# Patient Record
Sex: Female | Born: 1980 | Race: White | Marital: Single | Smoking: Never smoker
Health system: Southern US, Community
[De-identification: ages and names within clinical notes are randomized; demographics above are authoritative.]

## PROBLEM LIST (undated history)

## (undated) DIAGNOSIS — M419 Scoliosis, unspecified: Secondary | ICD-10-CM

## (undated) DIAGNOSIS — F329 Major depressive disorder, single episode, unspecified: Secondary | ICD-10-CM

## (undated) DIAGNOSIS — F32A Depression, unspecified: Secondary | ICD-10-CM

## (undated) HISTORY — DX: Scoliosis, unspecified: M41.9

## (undated) HISTORY — PX: LEEP: SHX91

## (undated) HISTORY — DX: Major depressive disorder, single episode, unspecified: F32.9

## (undated) HISTORY — DX: Depression, unspecified: F32.A

---

## 2016-01-08 ENCOUNTER — Ambulatory Visit (INDEPENDENT_AMBULATORY_CARE_PROVIDER_SITE_OTHER): Payer: Self-pay

## 2016-01-08 ENCOUNTER — Ambulatory Visit (INDEPENDENT_AMBULATORY_CARE_PROVIDER_SITE_OTHER): Payer: Self-pay | Admitting: Urgent Care

## 2016-01-08 VITALS — BP 118/70 | HR 105 | Temp 98.3°F | Resp 20 | Ht 69.5 in | Wt 185.4 lb

## 2016-01-08 DIAGNOSIS — N9489 Other specified conditions associated with female genital organs and menstrual cycle: Secondary | ICD-10-CM

## 2016-01-08 DIAGNOSIS — Z8739 Personal history of other diseases of the musculoskeletal system and connective tissue: Secondary | ICD-10-CM

## 2016-01-08 DIAGNOSIS — M545 Low back pain: Secondary | ICD-10-CM

## 2016-01-08 DIAGNOSIS — R102 Pelvic and perineal pain: Secondary | ICD-10-CM

## 2016-01-08 DIAGNOSIS — Z720 Tobacco use: Secondary | ICD-10-CM

## 2016-01-08 LAB — POCT URINALYSIS DIP (MANUAL ENTRY)
BILIRUBIN UA: NEGATIVE
GLUCOSE UA: NEGATIVE
Ketones, POC UA: NEGATIVE
Leukocytes, UA: NEGATIVE
Nitrite, UA: NEGATIVE
Protein Ur, POC: NEGATIVE
RBC UA: NEGATIVE
SPEC GRAV UA: 1.015
Urobilinogen, UA: 0.2
pH, UA: 6

## 2016-01-08 LAB — POC MICROSCOPIC URINALYSIS (UMFC): MUCUS RE: ABSENT

## 2016-01-08 LAB — POCT URINE PREGNANCY: PREG TEST UR: NEGATIVE

## 2016-01-08 MED ORDER — MELOXICAM 7.5 MG PO TABS: 7.5000 mg | ORAL_TABLET | Freq: Every day | ORAL | Status: AC

## 2016-01-08 MED ORDER — TIZANIDINE HCL 2 MG PO TABS: 2.0000 mg | ORAL_TABLET | Freq: Three times a day (TID) | ORAL | Status: DC | PRN

## 2016-01-08 NOTE — Progress Notes (Signed)
MRN: [id number] DOB: 1981/03/30  Subjective:   Courtney Cameron is a 35 y.o. female with pmh of scoliosis presenting for chief complaint of Back Pain  Reports ~1 week history of progressively worsening low back pain that radiates down to her knees bilaterally. Patient has had back pain before, has been helped by a chiropractor before. She went again 3 times in the past week without any relief. She has also had some pelvic pressure and difficulty defecating due to her back pain. Also tried Flexeril,  ibuprofen, tizanidine (did the best for her). Denies numbness or tingling, weakness, incontinence, fever, n/v, abdominal pain, dysuria, hematuria. Smokes 1/2 ppd, 10 year smoking history. Has 1-2 drinks of alcohol per week. Uses marijuana once every 2 months. Family history is positive for bladder cancer, currently in remission.  Courtney Cameron has a current medication list which includes the following prescription(s): cyclobenzaprine hcl and ibuprofen. Also has No Known Allergies.  Courtney Cameron  has a past medical history of Depression and Scoliosis. Also  has past surgical history that includes LEEP.  Objective:   Vitals: BP 118/70 mmHg  Pulse 105  Temp(Src) 98.3 F (36.8 C) (Oral)  Resp 20  Ht 5' 9.5" (1.765 m)  Wt 185 lb 6.4 oz (84.097 kg)  BMI 27.00 kg/m2  SpO2 99%  LMP 12/30/2015  Physical Exam  Constitutional: She is oriented to person, place, and time. She appears well-developed and well-nourished.  Cardiovascular: Normal rate, regular rhythm and intact distal pulses.  Exam reveals no gallop and no friction rub.   No murmur heard. Pulmonary/Chest: No respiratory distress. She has no wheezes. She has no rales.  Abdominal: Soft. Bowel sounds are normal. She exhibits no distension and no mass. There is no tenderness.  No CVA tenderness.  Musculoskeletal: She exhibits no edema.       Lumbar back: She exhibits decreased range of motion (flexion>extension) and tenderness (over area  depicted). She exhibits no swelling, no edema, no deformity, no laceration and no spasm.       Back:  Negative SLR.  Neurological: She is alert and oriented to person, place, and time. She has normal reflexes.  Skin: Skin is warm and dry.   Dg Lumbar Spine Complete  01/08/2016  CLINICAL DATA:  Lumbago EXAM: LUMBAR SPINE - COMPLETE 4+ VIEW COMPARISON:  None. FINDINGS: Frontal, lateral, spot lumbosacral lateral, and bilateral oblique views were obtained. There are 5 non-rib-bearing lumbar type vertebral bodies. There is levoscoliosis with a mild rotatory component. There is no fracture or spondylolisthesis. Disc spaces appear normal. There is no appreciable facet arthropathy. IMPRESSION: Scoliosis. No fracture or spondylolisthesis. No appreciable arthropathic change. Electronically Signed   By: Bretta Bang III M.D.   On: 01/08/2016 14:37   Results for orders placed or performed in visit on 01/08/16 (from the past 24 hour(s))  POCT urinalysis dipstick     Status: Abnormal   Collection Time: 01/08/16  2:37 PM  Result Value Ref Range   Color, UA yellow yellow   Clarity, UA cloudy (A) clear   Glucose, UA negative negative   Bilirubin, UA negative negative   Ketones, POC UA negative negative   Spec Grav, UA 1.015    Blood, UA negative negative   pH, UA 6.0    Protein Ur, POC negative negative   Urobilinogen, UA 0.2    Nitrite, UA Negative Negative   Leukocytes, UA Negative Negative  POCT Microscopic Urinalysis ([facility])     Status: Abnormal   Collection Time: 01/08/16  2:37 PM  Result Value Ref Range   WBC,UR,HPF,POC None None WBC/hpf   RBC,UR,HPF,POC None None RBC/hpf   Bacteria None None, Too numerous to count   Mucus Absent Absent   Epithelial Cells, UR Per Microscopy Few (A) None, Too numerous to count cells/hpf  POCT urine pregnancy     Status: None   Collection Time: 01/08/16  2:37 PM  Result Value Ref Range   Preg Test, Ur Negative Negative   Assessment and Plan :   1.  Bilateral low back pain, with sciatica presence unspecified 2. Pelvic pressure in female 3. History of scoliosis - Physical exam findings are reassuring, will manage back pain that may be related to her scoliosis with meloxicam and tizanidine. RTC in 2 weeks if no improvement, consider steroid course, MRI, PT.  4. Tobacco use - Patient is working on smoking cessation and will let me know if she needs medical therapy to help with this.  Wallis Bamberg, PA-C [facility] [phone] 01/08/2016 1:54 PM

## 2016-01-08 NOTE — Patient Instructions (Addendum)
Because you received an x-ray today, you will receive an invoice from [facility]. Please contact [facility] at [phone] with questions or concerns regarding your invoice. Our billing staff will not be able to assist you with those questions.   You can use Menthol or Capsaicin creams for your back pain. Heating pads are okay but not for long term use.   Scoliosis Scoliosis is the name given to a spine that curves sideways.Scoliosis can cause twisting of your shoulders, hips, chest, back, and rib cage.  CAUSES  The cause of scoliosis is not always known. It may be caused by a birth defect or by a disease that can cause muscular dysfunction and imbalance, such as cerebral palsy and muscular dystrophy.  RISK FACTORS Having a disease that causes muscle disease or dysfunction. SIGNS AND SYMPTOMS Scoliosis often has no signs or symptoms.If they are present, they may include:  Unequal size of one body side compared to the other (asymmetry).  Visible curvature of the spine.  Pain. The pain may limit physical activity.  Shortness of breath.  Bowel or bladder issues. DIAGNOSIS A skilled health care provider will perform an evaluation. This will involve:  Taking your history.  Performing a physical examination.  Performing a neurological exam to detect nerve or muscle function loss.  Range of motion studies on the spine.  X-rays. An MRI may also be obtained. TREATMENT  Treatment varies depending on the nature, extent, and severity of the disease. If the curvature is not great, you may need only observation. A brace may be used to prevent scoliosis from progressing. A brace may also be needed during growth spurts. Physical therapy may be of benefit. Surgery may be required.  HOME CARE INSTRUCTIONS   Your health care provider may suggest exercises to strengthen your muscles. Perform them as directed.  Ask your health care provider before participating in any  sports.   If you have been prescribed an orthopedic brace, wear it as instructed by your health care provider. SEEK MEDICAL CARE IF: Your brace causes the skin to become sore (chafe) or is uncomfortable.  SEEK IMMEDIATE MEDICAL CARE IF:  You have back pain that is not relieved by the medicines prescribed by your health care provider.   Your legs feel weak or you lose function in your legs.  You lose some bowel or bladder control.    This information is not intended to replace advice given to you by your health care provider. Make sure you discuss any questions you have with your health care provider.   Document Released: 10/28/2000 Document Revised: 11/05/2013 Document Reviewed: 07/08/2013 Elsevier Interactive Patient Education 2016 [employer].    Smoking Cessation, Tips for Success If you are ready to quit smoking, congratulations! You have chosen to help yourself be healthier. Cigarettes bring nicotine, tar, carbon monoxide, and other irritants into your body. Your lungs, heart, and blood vessels will be able to work better without these poisons. There are many different ways to quit smoking. Nicotine gum, nicotine patches, a nicotine inhaler, or nicotine nasal spray can help with physical craving. Hypnosis, support groups, and medicines help break the habit of smoking. WHAT THINGS CAN I DO TO MAKE QUITTING EASIER?  Here are some tips to help you quit for good:  Pick a date when you will quit smoking completely. Tell all of your friends and family about your plan to quit on that date.  Do not try to slowly cut down on the number of cigarettes  you are smoking. Pick a quit date and quit smoking completely starting on that day.  Throw away all cigarettes.   Clean and remove all ashtrays from your home, work, and car.  On a card, write down your reasons for quitting. Carry the card with you and read it when you get the urge to smoke.  Cleanse your body of nicotine. Drink  enough water and fluids to keep your urine clear or pale yellow. Do this after quitting to flush the nicotine from your body.  Learn to predict your moods. Do not let a bad situation be your excuse to have a cigarette. Some situations in your life might tempt you into wanting a cigarette.  Never have "just one" cigarette. It leads to wanting another and another. Remind yourself of your decision to quit.  Change habits associated with smoking. If you smoked while driving or when feeling stressed, try other activities to replace smoking. Stand up when drinking your coffee. Brush your teeth after eating. Sit in a different chair when you read the paper. Avoid alcohol while trying to quit, and try to drink fewer caffeinated beverages. Alcohol and caffeine may urge you to smoke.  Avoid foods and drinks that can trigger a desire to smoke, such as sugary or spicy foods and alcohol.  Ask people who smoke not to smoke around you.  Have something planned to do right after eating or having a cup of coffee. For example, plan to take a walk or exercise.  Try a relaxation exercise to calm you down and decrease your stress. Remember, you may be tense and nervous for the first 2 weeks after you quit, but this will pass.  Find new activities to keep your hands busy. Play with a pen, coin, or rubber band. Doodle or draw things on paper.  Brush your teeth right after eating. This will help cut down on the craving for the taste of tobacco after meals. You can also try mouthwash.   Use oral substitutes in place of cigarettes. Try using lemon drops, carrots, cinnamon sticks, or chewing gum. Keep them handy so they are available when you have the urge to smoke.  When you have the urge to smoke, try deep breathing.  Designate your home as a nonsmoking area.  If you are a heavy smoker, ask your health care provider about a prescription for nicotine chewing gum. It can ease your withdrawal from nicotine.  Reward  yourself. Set aside the cigarette money you save and buy yourself something nice.  Look for support from others. Join a support group or smoking cessation program. Ask someone at home or at work to help you with your plan to quit smoking.  Always ask yourself, "Do I need this cigarette or is this just a reflex?" Tell yourself, "Today, I choose not to smoke," or "I do not want to smoke." You are reminding yourself of your decision to quit.  Do not replace cigarette smoking with electronic cigarettes (commonly called e-cigarettes). The safety of e-cigarettes is unknown, and some may contain harmful chemicals.  If you relapse, do not give up! Plan ahead and think about what you will do the next time you get the urge to smoke. HOW WILL I FEEL WHEN I QUIT SMOKING? You may have symptoms of withdrawal because your body is used to nicotine (the addictive substance in cigarettes). You may crave cigarettes, be irritable, feel very hungry, cough often, get headaches, or have difficulty concentrating. The withdrawal symptoms are only  temporary. They are strongest when you first quit but will go away within 10-14 days. When withdrawal symptoms occur, stay in control. Think about your reasons for quitting. Remind yourself that these are signs that your body is healing and getting used to being without cigarettes. Remember that withdrawal symptoms are easier to treat than the major diseases that smoking can cause.  Even after the withdrawal is over, expect periodic urges to smoke. However, these cravings are generally short lived and will go away whether you smoke or not. Do not smoke! WHAT RESOURCES ARE AVAILABLE TO HELP ME QUIT SMOKING? Your health care provider can direct you to community resources or hospitals for support, which may include:  Group support.  Education.  Hypnosis.  Therapy.   This information is not intended to replace advice given to you by your health care provider. Make sure you  discuss any questions you have with your health care provider.   Document Released: 07/29/2004 Document Revised: 11/21/2014 Document Reviewed: 04/18/2013 Elsevier Interactive Patient Education [employer]

## 2016-01-09 ENCOUNTER — Telehealth: Payer: Self-pay

## 2016-01-09 NOTE — Telephone Encounter (Signed)
PATIENT STATES SHE WAS IN THE OFFICE TO SEE MARIO MANI FOR BACK PAIN ON Friday. SHE SAID SHE FORGOT TO GET A DOCTOR'S NOTE FOR WORK. SHE WAS OUT ON FRI. 01/08/2016 AND WILL PROBABLY NEED TO BE OUT AT LEAST A WEEK. WHEN SHE IS CALLED BACK SHE WILL HAVE THE FAX NUMBER TO HER JOB SO THAT THE NOTE CAN BE FAXED. BEST PHONE [phone] (CELL)  MBC

## 2016-01-09 NOTE — Telephone Encounter (Signed)
PT CALLED BACK - WORK NOTE TO BE FAXED TO IS [phone] - ATTENTION GINA

## 2016-01-11 NOTE — Telephone Encounter (Signed)
Faxed

## 2017-01-07 ENCOUNTER — Other Ambulatory Visit: Payer: Self-pay | Admitting: Urgent Care

## 2017-01-07 NOTE — Telephone Encounter (Signed)
Pt's rx is expired and would like renewal for Tyzanadine 2mg / (pt still had refills)/ [facility] [address]  [phone] VM okay

## 2017-01-09 NOTE — Telephone Encounter (Signed)
Meds ordered this encounter  Medications  . tiZANidine (ZANAFLEX) 2 MG tablet    Sig: TAKE 1 TABLET BY MOUTH EVERY 8 HOURS AS NEEDED FOR MUSCLE SPASMS    Dispense:  30 tablet    Refill:  0    Please advise patient she needs re-evaluation for additional fills.

## 2017-04-20 IMAGING — CR DG LUMBAR SPINE COMPLETE 4+V
5 series · 5 of 5 positions shown · non-contrast
Comparison: None.

CLINICAL DATA: Lumbago

EXAM:
LUMBAR SPINE - COMPLETE 4+ VIEW

[AP (1 of 2)]
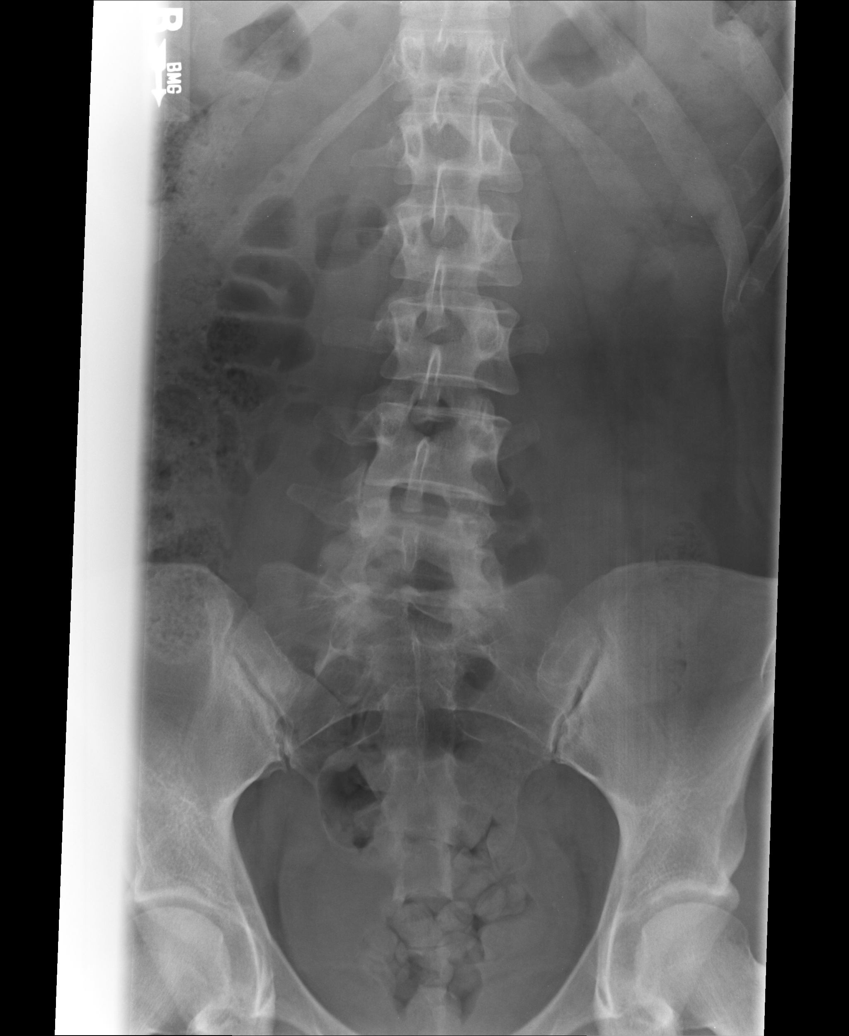

[AP (2 of 2)]
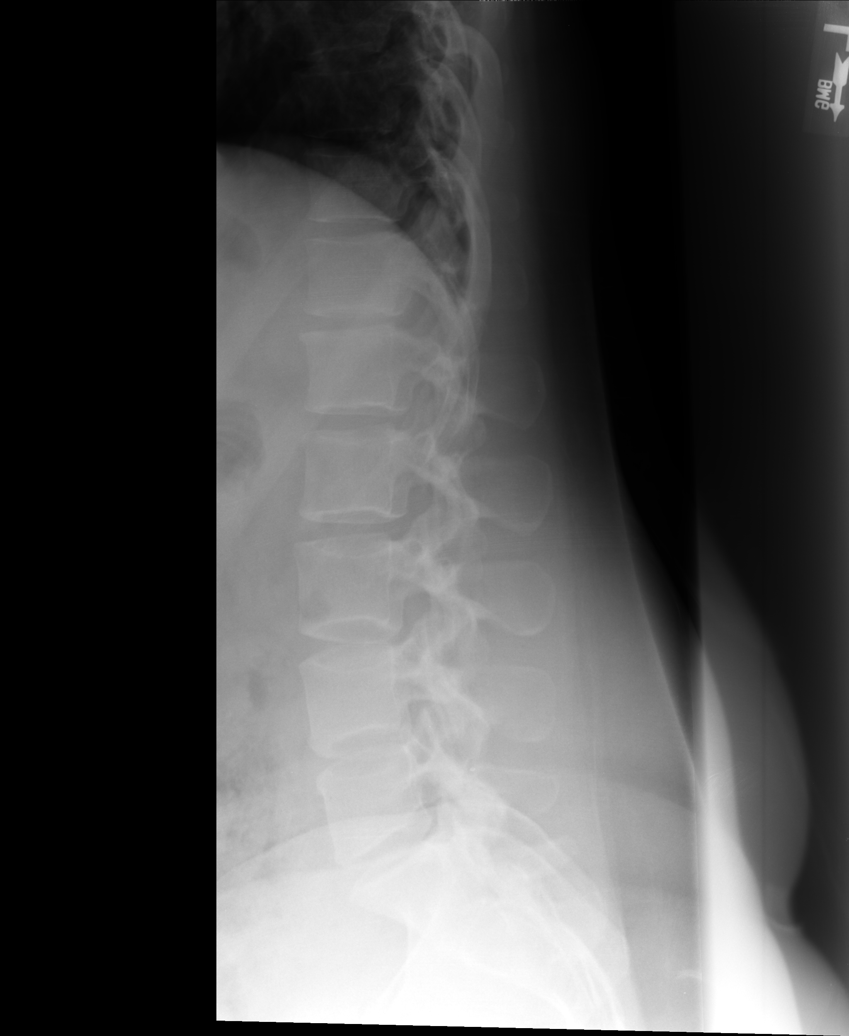

[l5 s1]
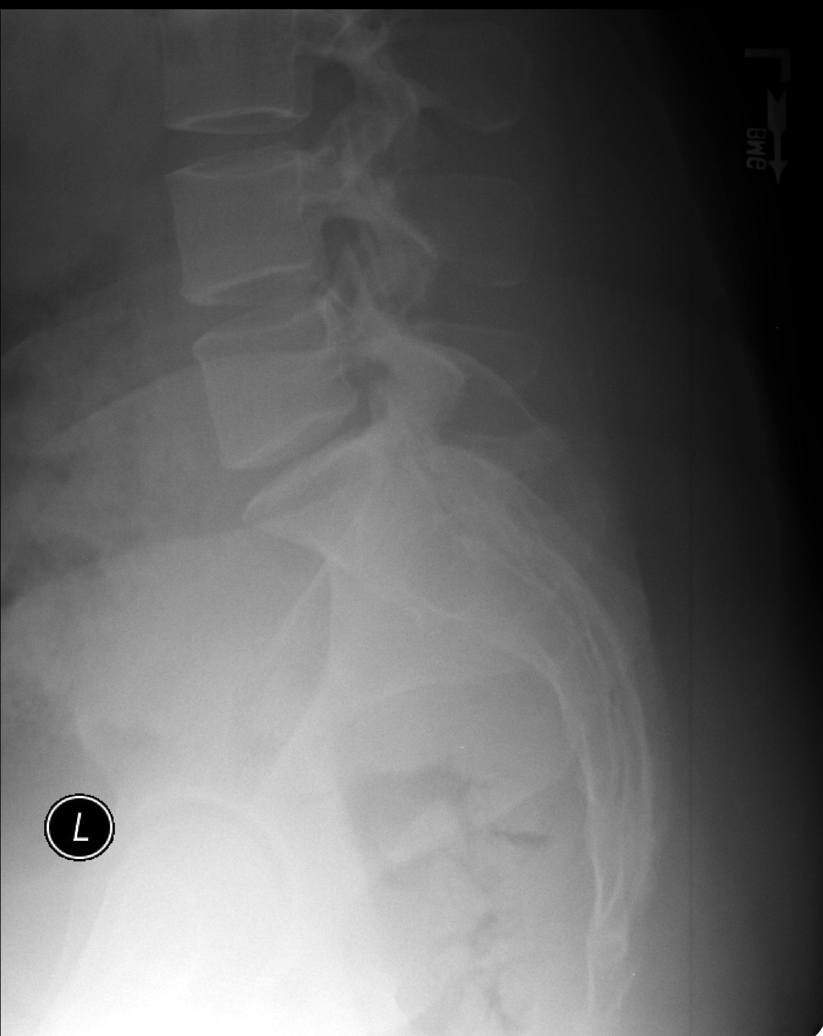

[rpo]
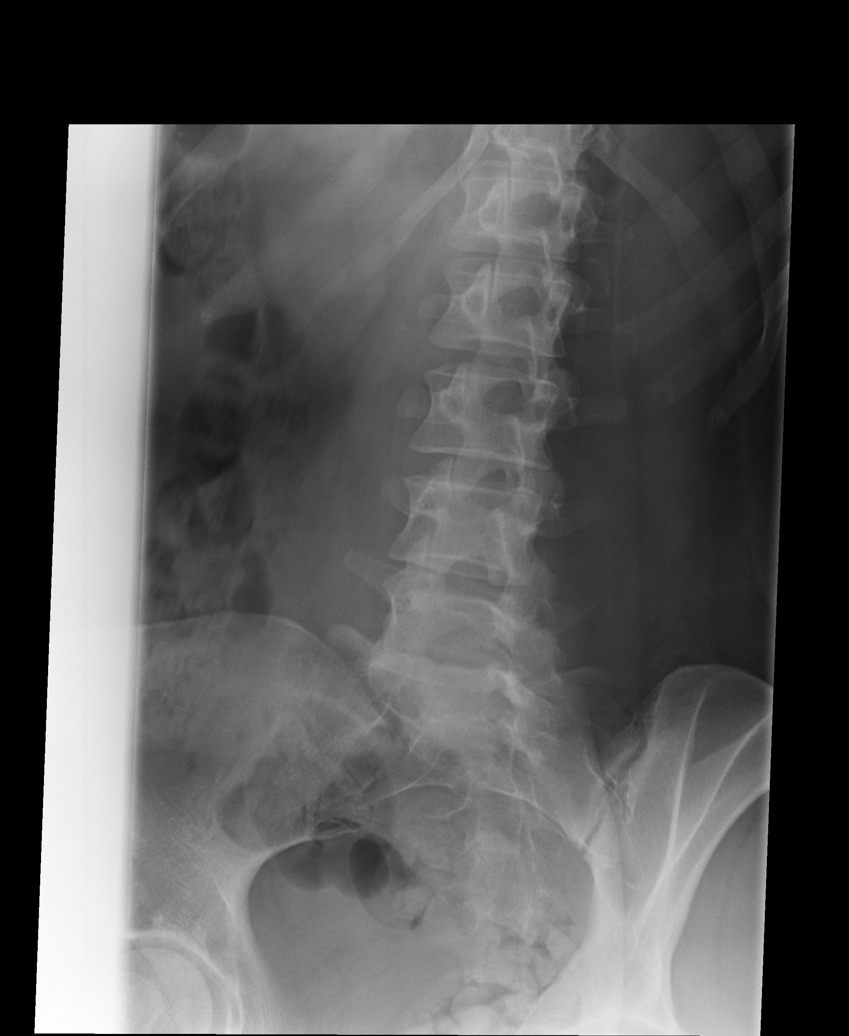

[lpo]
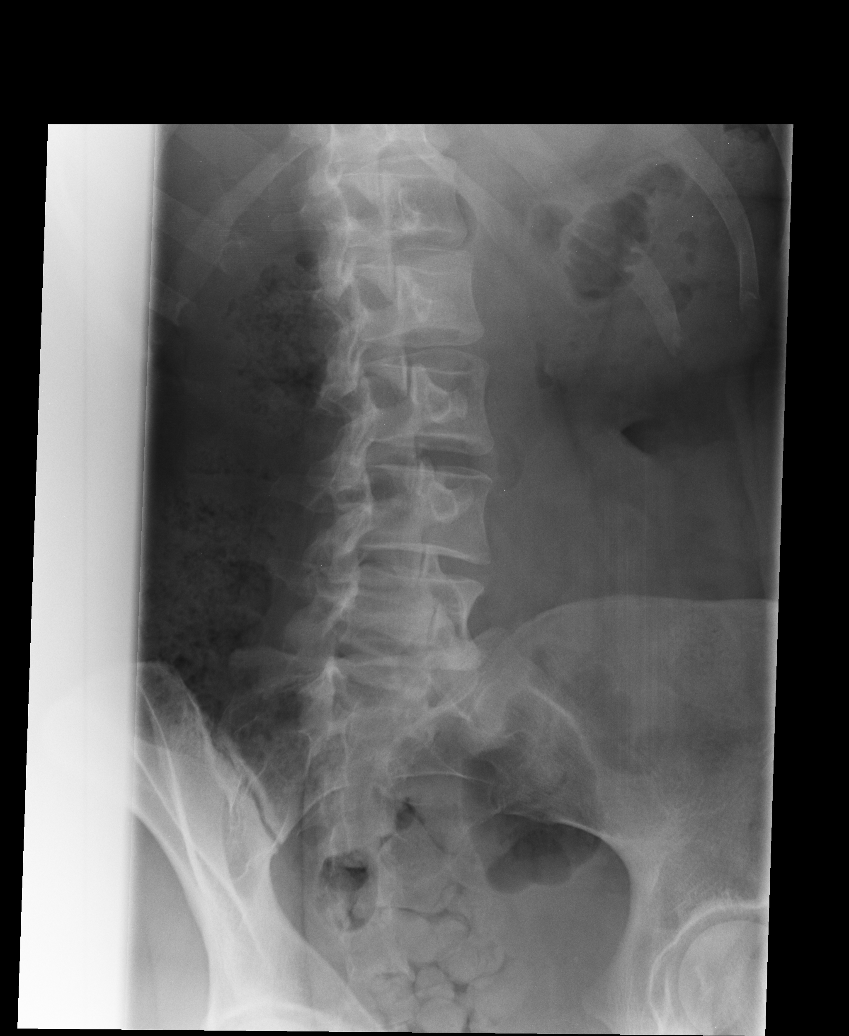

[5 of 5 positions shown; findings below may reference images not displayed]

FINDINGS: Frontal, lateral, spot lumbosacral lateral, and bilateral oblique
views were obtained. There are 5 non-rib-bearing lumbar type
vertebral bodies. There is levoscoliosis with a mild rotatory
component. There is no fracture or spondylolisthesis. Disc spaces
appear normal. There is no appreciable facet arthropathy.
IMPRESSION: Scoliosis. No fracture or spondylolisthesis. No appreciable
arthropathic change.
